# Patient Record
Sex: Male | Born: 1960 | Race: Black or African American | Hispanic: No | Marital: Married | Smoking: Former smoker
Health system: Southern US, Community
[De-identification: ages and names within clinical notes are randomized; demographics above are authoritative.]

## PROBLEM LIST (undated history)

## (undated) DIAGNOSIS — E119 Type 2 diabetes mellitus without complications: Secondary | ICD-10-CM

## (undated) DIAGNOSIS — I10 Essential (primary) hypertension: Secondary | ICD-10-CM

## (undated) HISTORY — PX: EYE SURGERY: SHX253

## (undated) HISTORY — DX: Essential (primary) hypertension: I10

## (undated) HISTORY — DX: Type 2 diabetes mellitus without complications: E11.9

---

## 2009-02-08 DEATH — deceased

## 2018-12-13 DIAGNOSIS — Z794 Long term (current) use of insulin: Secondary | ICD-10-CM | POA: Diagnosis not present

## 2018-12-13 DIAGNOSIS — E1139 Type 2 diabetes mellitus with other diabetic ophthalmic complication: Secondary | ICD-10-CM | POA: Diagnosis not present

## 2018-12-13 DIAGNOSIS — H401133 Primary open-angle glaucoma, bilateral, severe stage: Secondary | ICD-10-CM | POA: Diagnosis not present

## 2018-12-20 DIAGNOSIS — Z23 Encounter for immunization: Secondary | ICD-10-CM | POA: Diagnosis not present

## 2018-12-20 DIAGNOSIS — I10 Essential (primary) hypertension: Secondary | ICD-10-CM | POA: Diagnosis not present

## 2018-12-20 DIAGNOSIS — R5383 Other fatigue: Secondary | ICD-10-CM | POA: Diagnosis not present

## 2018-12-20 DIAGNOSIS — E559 Vitamin D deficiency, unspecified: Secondary | ICD-10-CM | POA: Diagnosis not present

## 2018-12-20 DIAGNOSIS — E119 Type 2 diabetes mellitus without complications: Secondary | ICD-10-CM | POA: Diagnosis not present

## 2018-12-21 DIAGNOSIS — H0014 Chalazion left upper eyelid: Secondary | ICD-10-CM | POA: Diagnosis not present

## 2018-12-21 DIAGNOSIS — H02886 Meibomian gland dysfunction of left eye, unspecified eyelid: Secondary | ICD-10-CM | POA: Diagnosis not present

## 2018-12-21 DIAGNOSIS — Z79899 Other long term (current) drug therapy: Secondary | ICD-10-CM | POA: Diagnosis not present

## 2018-12-21 DIAGNOSIS — Z9689 Presence of other specified functional implants: Secondary | ICD-10-CM | POA: Diagnosis not present

## 2018-12-21 DIAGNOSIS — H35372 Puckering of macula, left eye: Secondary | ICD-10-CM | POA: Diagnosis not present

## 2018-12-21 DIAGNOSIS — Z961 Presence of intraocular lens: Secondary | ICD-10-CM | POA: Diagnosis not present

## 2018-12-21 DIAGNOSIS — E1139 Type 2 diabetes mellitus with other diabetic ophthalmic complication: Secondary | ICD-10-CM | POA: Diagnosis not present

## 2018-12-21 DIAGNOSIS — Z9889 Other specified postprocedural states: Secondary | ICD-10-CM | POA: Diagnosis not present

## 2018-12-21 DIAGNOSIS — H00014 Hordeolum externum left upper eyelid: Secondary | ICD-10-CM | POA: Diagnosis not present

## 2018-12-21 DIAGNOSIS — H401113 Primary open-angle glaucoma, right eye, severe stage: Secondary | ICD-10-CM | POA: Diagnosis not present

## 2018-12-21 DIAGNOSIS — Z7982 Long term (current) use of aspirin: Secondary | ICD-10-CM | POA: Diagnosis not present

## 2018-12-21 DIAGNOSIS — Z9883 Filtering (vitreous) bleb after glaucoma surgery status: Secondary | ICD-10-CM | POA: Diagnosis not present

## 2018-12-21 DIAGNOSIS — Z9842 Cataract extraction status, left eye: Secondary | ICD-10-CM | POA: Diagnosis not present

## 2018-12-21 DIAGNOSIS — Z87891 Personal history of nicotine dependence: Secondary | ICD-10-CM | POA: Diagnosis not present

## 2018-12-21 DIAGNOSIS — E669 Obesity, unspecified: Secondary | ICD-10-CM | POA: Diagnosis not present

## 2018-12-21 DIAGNOSIS — Z794 Long term (current) use of insulin: Secondary | ICD-10-CM | POA: Diagnosis not present

## 2018-12-21 DIAGNOSIS — H4010X3 Unspecified open-angle glaucoma, severe stage: Secondary | ICD-10-CM | POA: Diagnosis not present

## 2018-12-21 DIAGNOSIS — H02883 Meibomian gland dysfunction of right eye, unspecified eyelid: Secondary | ICD-10-CM | POA: Diagnosis not present

## 2018-12-21 DIAGNOSIS — H16223 Keratoconjunctivitis sicca, not specified as Sjogren's, bilateral: Secondary | ICD-10-CM | POA: Diagnosis not present

## 2019-01-03 DIAGNOSIS — E7800 Pure hypercholesterolemia, unspecified: Secondary | ICD-10-CM | POA: Diagnosis not present

## 2019-01-03 DIAGNOSIS — E559 Vitamin D deficiency, unspecified: Secondary | ICD-10-CM | POA: Diagnosis not present

## 2019-01-03 DIAGNOSIS — I10 Essential (primary) hypertension: Secondary | ICD-10-CM | POA: Diagnosis not present

## 2019-01-03 DIAGNOSIS — R05 Cough: Secondary | ICD-10-CM | POA: Diagnosis not present

## 2019-01-03 DIAGNOSIS — E119 Type 2 diabetes mellitus without complications: Secondary | ICD-10-CM | POA: Diagnosis not present

## 2019-01-03 DIAGNOSIS — R748 Abnormal levels of other serum enzymes: Secondary | ICD-10-CM | POA: Diagnosis not present

## 2019-01-07 DIAGNOSIS — R748 Abnormal levels of other serum enzymes: Secondary | ICD-10-CM | POA: Diagnosis not present

## 2019-01-17 DIAGNOSIS — R748 Abnormal levels of other serum enzymes: Secondary | ICD-10-CM | POA: Diagnosis not present

## 2019-01-17 DIAGNOSIS — E119 Type 2 diabetes mellitus without complications: Secondary | ICD-10-CM | POA: Diagnosis not present

## 2019-01-17 DIAGNOSIS — I10 Essential (primary) hypertension: Secondary | ICD-10-CM | POA: Diagnosis not present

## 2019-01-17 DIAGNOSIS — E7800 Pure hypercholesterolemia, unspecified: Secondary | ICD-10-CM | POA: Diagnosis not present

## 2020-05-11 ENCOUNTER — Encounter (HOSPITAL_COMMUNITY): Payer: Self-pay | Admitting: *Deleted

## 2020-05-11 ENCOUNTER — Emergency Department (HOSPITAL_COMMUNITY): Payer: Medicare Other

## 2020-05-11 ENCOUNTER — Other Ambulatory Visit: Payer: Self-pay

## 2020-05-11 ENCOUNTER — Emergency Department (HOSPITAL_COMMUNITY)
Admission: EM | Admit: 2020-05-11 | Discharge: 2020-05-11 | Disposition: A | Discharge status: Home / Self Care | Payer: Medicare Other | Attending: Emergency Medicine | Admitting: Emergency Medicine

## 2020-05-11 DIAGNOSIS — Z7984 Long term (current) use of oral hypoglycemic drugs: Secondary | ICD-10-CM | POA: Insufficient documentation

## 2020-05-11 DIAGNOSIS — E119 Type 2 diabetes mellitus without complications: Secondary | ICD-10-CM | POA: Diagnosis not present

## 2020-05-11 DIAGNOSIS — R112 Nausea with vomiting, unspecified: Secondary | ICD-10-CM | POA: Diagnosis present

## 2020-05-11 DIAGNOSIS — Z79899 Other long term (current) drug therapy: Secondary | ICD-10-CM | POA: Insufficient documentation

## 2020-05-11 DIAGNOSIS — Z87891 Personal history of nicotine dependence: Secondary | ICD-10-CM | POA: Diagnosis not present

## 2020-05-11 DIAGNOSIS — I10 Essential (primary) hypertension: Secondary | ICD-10-CM | POA: Insufficient documentation

## 2020-05-11 LAB — TROPONIN I (HIGH SENSITIVITY): Troponin I (High Sensitivity): 4 ng/L (ref <18)

## 2020-05-11 LAB — CBC WITH DIFFERENTIAL/PLATELET
Abs Immature Granulocytes: 0.02 10*3/uL (ref 0.00–0.07)
Basophils Absolute: 0 10*3/uL (ref 0.0–0.1)
Basophils Relative: 0 %
Eosinophils Absolute: 0 10*3/uL (ref 0.0–0.5)
Eosinophils Relative: 0 %
HCT: 45.3 % (ref 39.0–52.0)
Hemoglobin: 15.5 g/dL (ref 13.0–17.0)
Immature Granulocytes: 0 %
Lymphocytes Relative: 21 %
Lymphs Abs: 1.6 10*3/uL (ref 0.7–4.0)
MCH: 32.3 pg (ref 26.0–34.0)
MCHC: 34.2 g/dL (ref 30.0–36.0)
MCV: 94.4 fL (ref 80.0–100.0)
Monocytes Absolute: 0.4 10*3/uL (ref 0.1–1.0)
Monocytes Relative: 5 %
Neutro Abs: 5.6 10*3/uL (ref 1.7–7.7)
Neutrophils Relative %: 74 %
Platelets: 289 10*3/uL (ref 150–400)
RBC: 4.8 MIL/uL (ref 4.22–5.81)
RDW: 11.9 % (ref 11.5–15.5)
WBC: 7.6 10*3/uL (ref 4.0–10.5)
nRBC: 0 % (ref 0.0–0.2)

## 2020-05-11 LAB — URINALYSIS, ROUTINE W REFLEX MICROSCOPIC
Bacteria, UA: NONE SEEN
Bilirubin Urine: NEGATIVE
Glucose, UA: >=500 mg/dL — AB
Ketones, ur: 20 mg/dL — AB
Leukocytes,Ua: NEGATIVE
Nitrite: NEGATIVE
Protein, ur: NEGATIVE mg/dL
Specific Gravity, Urine: 1.03 (ref 1.005–1.030)
pH: 6 (ref 5.0–8.0)

## 2020-05-11 LAB — COMPREHENSIVE METABOLIC PANEL
ALT: 24 U/L (ref 0–44)
AST: 14 U/L — ABNORMAL LOW (ref 15–41)
Albumin: 4.7 g/dL (ref 3.5–5.0)
Alkaline Phosphatase: 105 U/L (ref 38–126)
Anion gap: 13 (ref 5–15)
BUN: 22 mg/dL — ABNORMAL HIGH (ref 6–20)
CO2: 21 mmol/L — ABNORMAL LOW (ref 22–32)
Calcium: 10 mg/dL (ref 8.9–10.3)
Chloride: 104 mmol/L (ref 98–111)
Creatinine, Ser: 0.99 mg/dL (ref 0.61–1.24)
GFR calc Af Amer: >60 mL/min (ref >60)
GFR calc non Af Amer: >60 mL/min (ref >60)
Glucose, Bld: 327 mg/dL — ABNORMAL HIGH (ref 70–99)
Potassium: 4.1 mmol/L (ref 3.5–5.1)
Sodium: 138 mmol/L (ref 135–145)
Total Bilirubin: 1.2 mg/dL (ref 0.3–1.2)
Total Protein: 8.4 g/dL — ABNORMAL HIGH (ref 6.5–8.1)

## 2020-05-11 MED ORDER — SODIUM CHLORIDE 0.9 % IV BOLUS
1000.0000 mL | Freq: Once | INTRAVENOUS | Status: AC
  Administered 2020-05-11: 1000 mL via INTRAVENOUS

## 2020-05-11 MED ORDER — ONDANSETRON 8 MG PO TBDP: 8.0000 mg | ORAL_TABLET | Freq: Three times a day (TID) | ORAL | 0 refills | Status: AC | PRN

## 2020-05-11 NOTE — ED Provider Notes (Signed)
[facility] EMERGENCY DEPARTMENT Provider Note   CSN: [id number] Arrival date & time: 05/11/20  1821     History Chief Complaint  Patient presents with   Emesis    Clinton Harris is a 59 y.o. male.  HPI   59 year old male comes in a chief complaint of vomiting.  Patient has history of diabetes.  He reports that over the last 2 days he has had persistent nausea with 2 episodes of emesis.  The emesis is nonbilious.  He has no abdominal pain and reports that he did have a BM today and is passing flatus.  No history of abdominal surgeries.  No UTI-like symptoms, chest pain, shortness of breath, dizziness.  He had fish for dinner the night before his symptoms started, but family had the same fish and were not sick.  Past Medical History:  Diagnosis Date   Diabetes mellitus without complication (HCC)    Hypertension     There are no problems to display for this patient.   Past Surgical History:  Procedure Laterality Date   EYE SURGERY         No family history on file.  Social History   Tobacco Use   Smoking status: Former Smoker   Smokeless tobacco: Never Used  Substance Use Topics   Alcohol use: Not Currently   Drug use: Not Currently    Home Medications Prior to Admission medications   Medication Sig Start Date End Date Taking? Authorizing Provider  aspirin 81 MG chewable tablet Chew 1 tablet by mouth daily.   Yes [provider]  atorvastatin (LIPITOR) 20 MG tablet Take 20 mg by mouth daily. 05/05/20  Yes [provider]  brimonidine (ALPHAGAN) 0.2 % ophthalmic solution Place 1 drop into both eyes daily. 03/27/20  Yes [provider]  canagliflozin (INVOKANA) 100 MG TABS tablet Take 1 tablet by mouth daily.   Yes [provider]  dorzolamide-timolol (COSOPT) 22.3-6.8 MG/ML ophthalmic solution Place 1 drop into both eyes 2 (two) times daily. 03/27/20  Yes [provider]  gabapentin (NEURONTIN) 100 MG capsule Take  1 capsule by mouth daily.   Yes [provider]  insulin lispro (HUMALOG) 100 UNIT/ML injection Inject into the skin.   Yes [provider]  ketorolac (ACULAR) 0.5 % ophthalmic solution Place 1 drop into both eyes 4 (four) times daily. 04/26/20  Yes [provider]  LANTUS SOLOSTAR 100 UNIT/ML Solostar Pen Inject 30 Units into the skin at bedtime. 05/01/20  Yes [provider]  losartan (COZAAR) 25 MG tablet Take 25 mg by mouth daily. 04/09/20  Yes [provider]  LUMIGAN 0.01 % SOLN Place 1 drop into both eyes daily. 04/26/20  Yes [provider]  metFORMIN (GLUCOPHAGE) 1000 MG tablet Take 1,000 mg by mouth 2 (two) times daily. 02/15/20  Yes [provider]  ondansetron (ZOFRAN ODT) 8 MG disintegrating tablet Take 1 tablet (8 mg total) by mouth every 8 (eight) hours as needed for nausea. 05/11/20   Derwood Kaplan, MD    Allergies    Patient has no known allergies.  Review of Systems   Review of Systems  Constitutional: Positive for activity change.  Respiratory: Negative for shortness of breath.   Cardiovascular: Negative for chest pain.  Gastrointestinal: Positive for nausea and vomiting.  Neurological: Negative for dizziness and light-headedness.  All other systems reviewed and are negative.   Physical Exam Updated Vital Signs BP (!) 176/89 (BP Location: Right Arm)    Pulse  64    Temp 98 F (36.7 C) (Oral)    Resp 20    Ht 6\' 1"  (1.854 m)    Wt 102.1 kg    SpO2 100%    BMI 29.69 kg/m   Physical Exam Vitals and nursing note reviewed.  Constitutional:      Appearance: He is well-developed.  HENT:     Head: Atraumatic.  Cardiovascular:     Rate and Rhythm: Normal rate.  Pulmonary:     Effort: Pulmonary effort is normal.  Musculoskeletal:     Cervical back: Neck supple.  Skin:    General: Skin is warm.  Neurological:     Mental Status: He is alert and oriented to person, place, and time.     ED Results /  Procedures / Treatments   Labs (all labs ordered are listed, but only abnormal results are displayed) Labs Reviewed  COMPREHENSIVE METABOLIC PANEL - Abnormal; Notable for the following components:      Result Value   CO2 21 (*)    Glucose, Bld 327 (*)    BUN 22 (*)    Total Protein 8.4 (*)    AST 14 (*)    All other components within normal limits  URINALYSIS, ROUTINE W REFLEX MICROSCOPIC - Abnormal; Notable for the following components:   Glucose, UA >=500 (*)    Hgb urine dipstick SMALL (*)    Ketones, ur 20 (*)    All other components within normal limits  CBC WITH DIFFERENTIAL/PLATELET  TROPONIN I (HIGH SENSITIVITY)  TROPONIN I (HIGH SENSITIVITY)    EKG EKG Interpretation  Date/Time:  Friday May 11 2020 20:27:22 EDT Ventricular Rate:  71 PR Interval:    QRS Duration: 90 QT Interval:  429 QTC Calculation: 467 R Axis:   36 Text Interpretation: Sinus arrhythmia No acute changes No old tracing to compare Confirmed by 05-20-2005 [phone]) on 05/11/2020 9:25:36 PM   Radiology DG ABD ACUTE 2+V W 1V CHEST  Result Date: 05/11/2020 CLINICAL DATA:  Vomiting EXAM: DG ABDOMEN ACUTE W/ 1V CHEST COMPARISON:  None. FINDINGS: There is no evidence of dilated bowel loops or free intraperitoneal air. No radiopaque calculi or other significant radiographic abnormality is seen. Heart size and mediastinal contours are within normal limits. Both lungs are clear. IMPRESSION: Negative abdominal radiographs.  No acute cardiopulmonary disease. Electronically Signed   By: 07/12/2020 M.D.   On: 05/11/2020 21:56    Procedures Procedures (including critical care time)  Medications Ordered in ED Medications  sodium chloride 0.9 % bolus 1,000 mL (0 mLs Intravenous Stopped 05/11/20 2242)    ED Course  I have reviewed the triage vital signs and the nursing notes.  Pertinent labs & imaging results that were available during my care of the patient were reviewed by me and considered in my medical  decision making (see chart for details).    MDM Rules/Calculators/A&P                          Clinton Harris was evaluated in Emergency Department on 05/11/2020 for the symptoms described in the history of present illness. He was evaluated in the context of the global COVID-19 pandemic, which necessitated consideration that the patient might be at risk for infection with the SARS-CoV-2 virus that causes COVID-19. Institutional protocols and algorithms that pertain to the evaluation of patients at risk for COVID-19 are in a state of rapid change based on information released by  regulatory bodies including the CDC and federal and state organizations. These policies and algorithms were followed during the patient's care in the ED.   Patient comes in a chief complaint of nausea and vomiting.  Patient is fully vaccinated with COVID-19 and denies any sick exposures.  He is having nausea and vomiting without any abdominal pain, diarrhea, URI-like symptoms, chest pain, dizziness.  We ordered UA and it does not show any signs of infection.  Acute abdominal series does not show any air-fluid levels.    Patient has passed oral challenge.  He feels better.  He still for discharge with strict ER return precautions.  Final Clinical Impression(s) / ED Diagnoses Final diagnoses:  Non-intractable vomiting with nausea, unspecified vomiting type    Rx / DC Orders ED Discharge Orders         Ordered    ondansetron (ZOFRAN ODT) 8 MG disintegrating tablet  Every 8 hours PRN     Discontinue  Reprint     05/11/20 2312           Derwood Kaplan, MD 05/11/20 2345

## 2020-05-11 NOTE — ED Triage Notes (Signed)
Vomiting for the past 2 days 

## 2020-05-11 NOTE — Discharge Instructions (Signed)
The work-up in the ER is reassuring. The blood work and the x-ray did not show any signs of infection or obstruction.  We recommend that you take clear liquid diet for the next couple of days.  Advance to normal diet thereafter if you do well.  Take the nausea medication as prescribed.  Return to the ER if you start having severe pain, intractable vomiting, fevers, bloody stools.

## 2021-07-27 IMAGING — DX DG ABDOMEN ACUTE W/ 1V CHEST
4 series · 4 of 4 positions shown · non-contrast
Comparison: None.

CLINICAL DATA: Vomiting

EXAM:
DG ABDOMEN ACUTE W/ 1V CHEST

[chest pa]
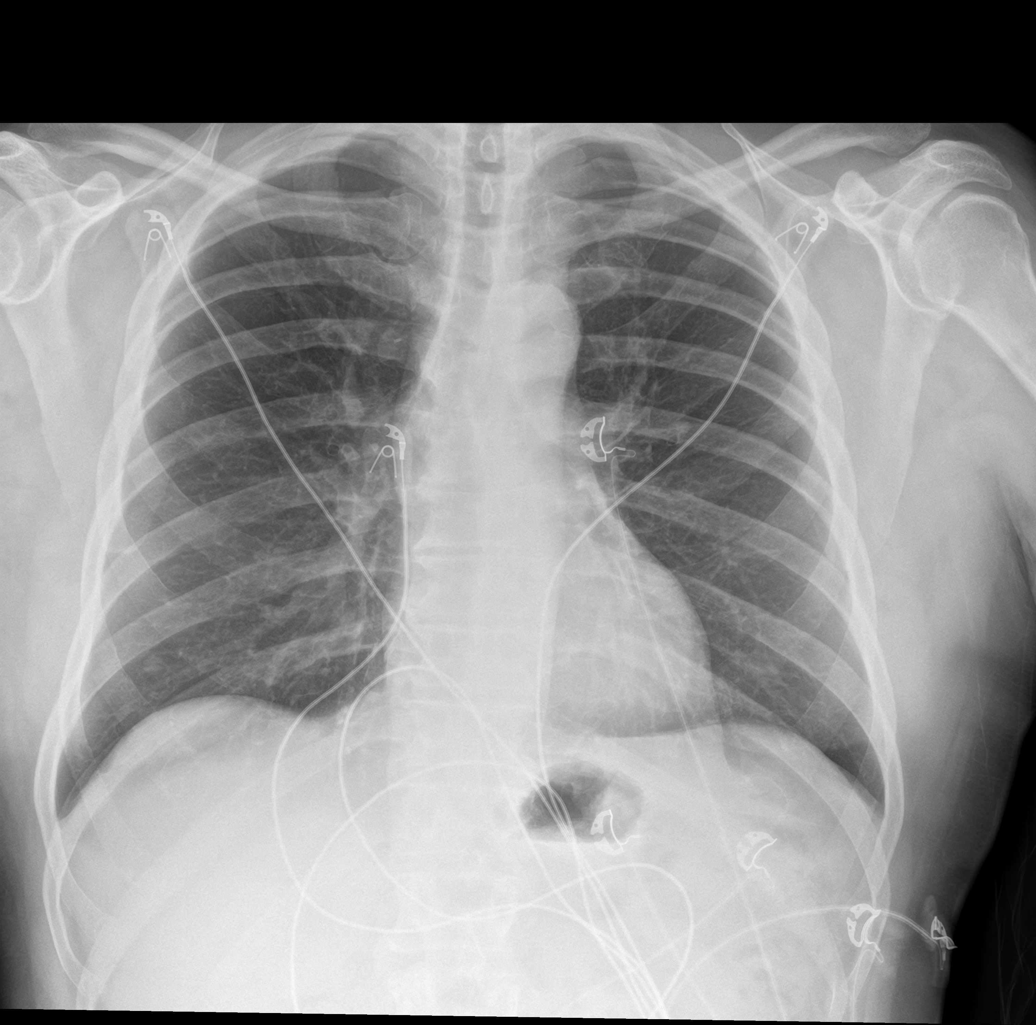

[abdomen erect]
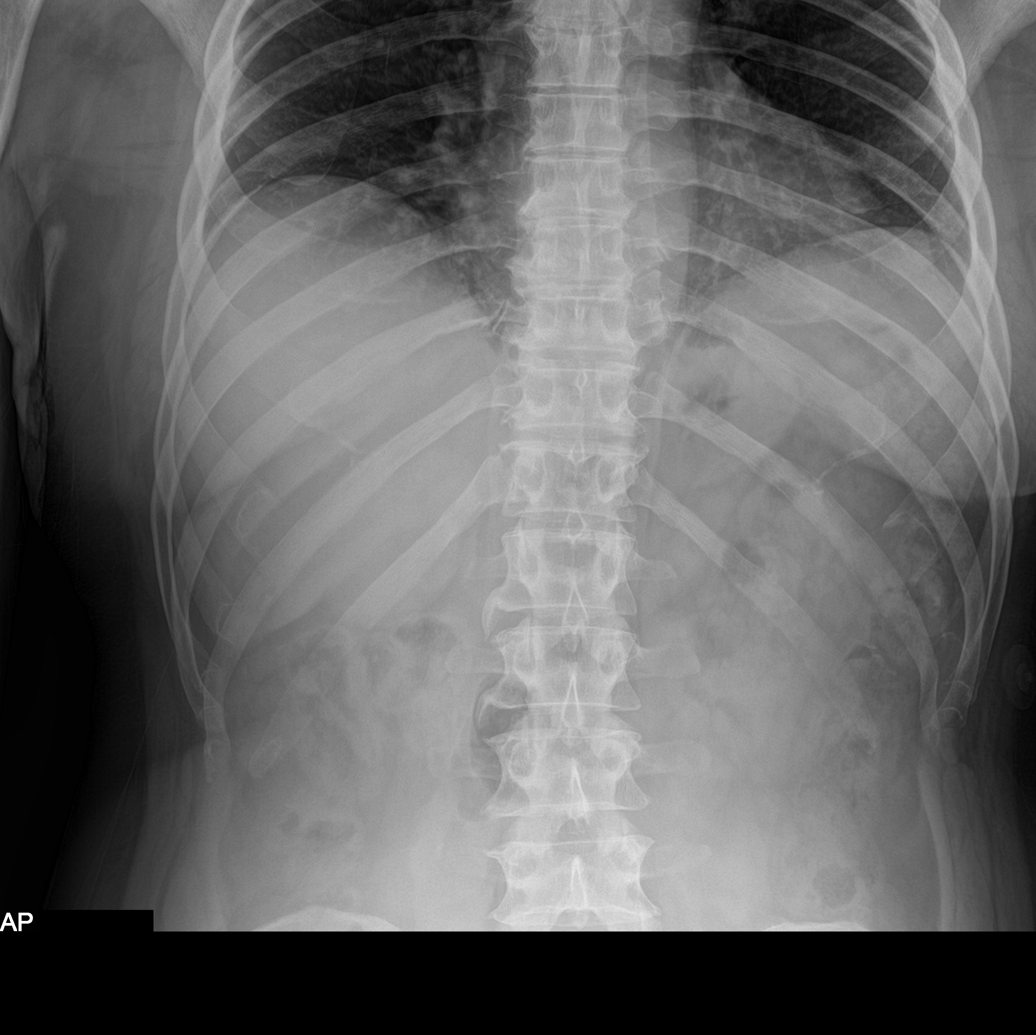

[abdomen supine (1 of 2)]
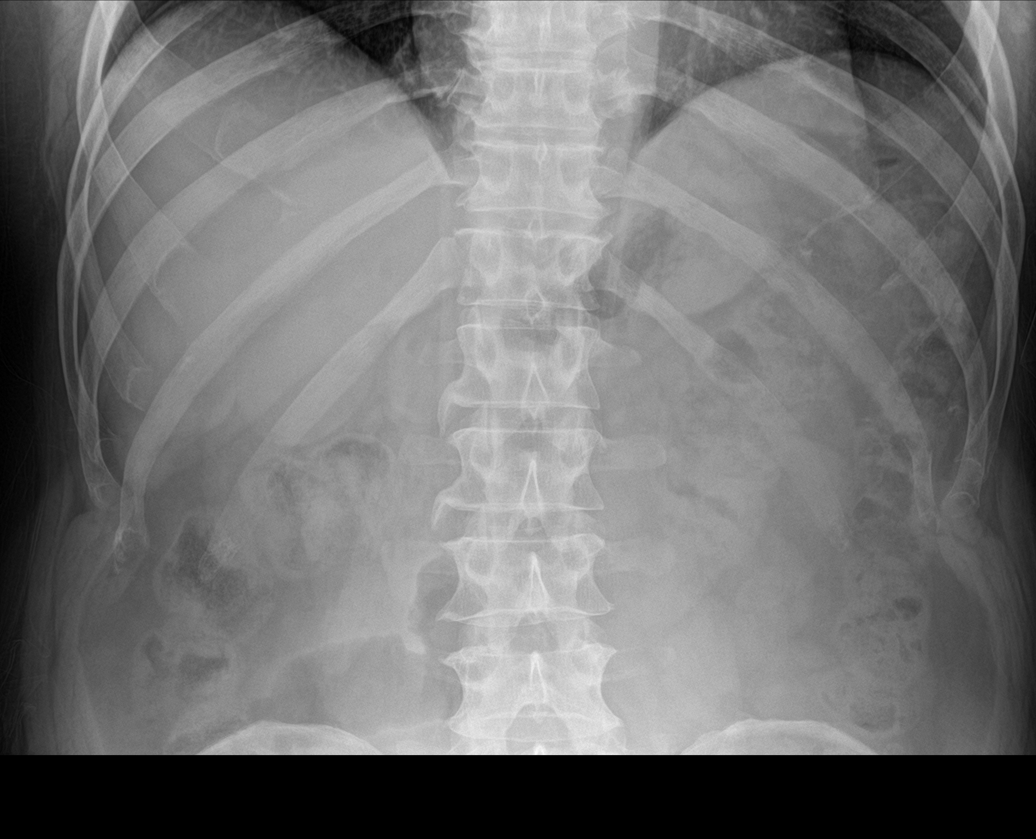

[abdomen supine (2 of 2)]
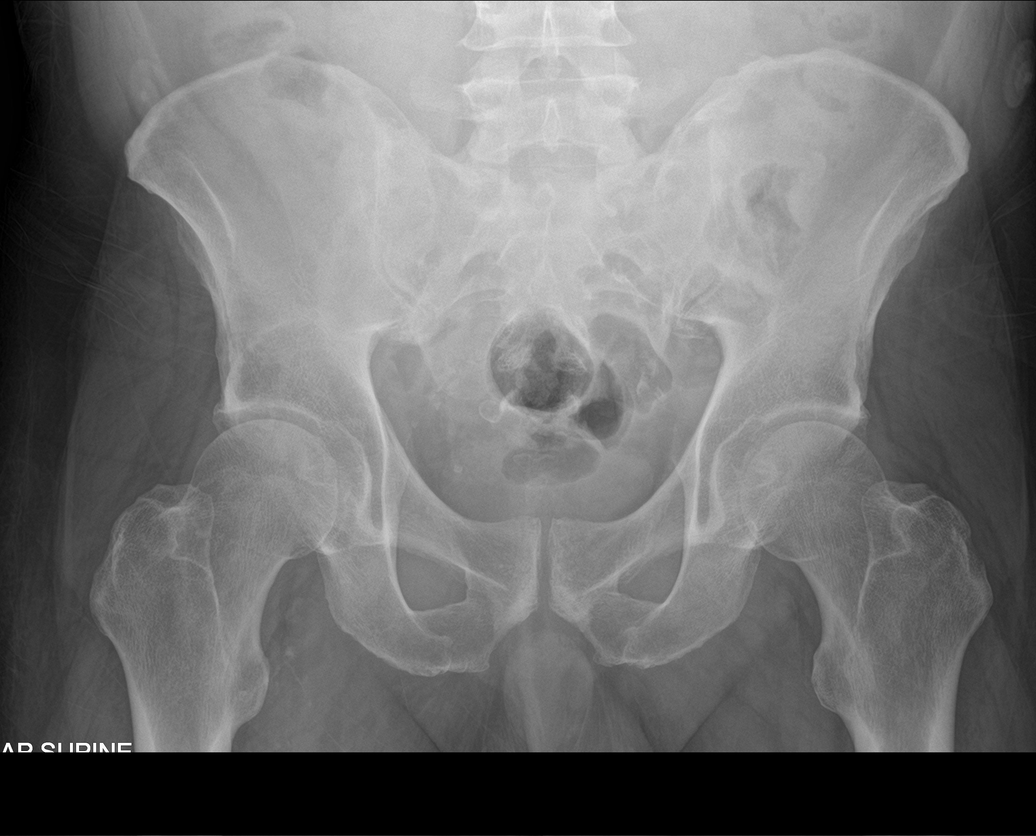

[4 of 4 positions shown; findings below may reference images not displayed]

FINDINGS: There is no evidence of dilated bowel loops or free intraperitoneal
air. No radiopaque calculi or other significant radiographic
abnormality is seen. Heart size and mediastinal contours are within
normal limits. Both lungs are clear.
IMPRESSION: Negative abdominal radiographs.  No acute cardiopulmonary disease.
